# Patient Record
Sex: Female | Born: 1965 | Race: White | Hispanic: No | Smoking: Former smoker
Health system: Southern US, Community
[De-identification: ages and names within clinical notes are randomized; demographics above are authoritative.]

## PROBLEM LIST (undated history)

## (undated) DIAGNOSIS — R197 Diarrhea, unspecified: Secondary | ICD-10-CM

## (undated) DIAGNOSIS — M542 Cervicalgia: Secondary | ICD-10-CM

## (undated) DIAGNOSIS — G43909 Migraine, unspecified, not intractable, without status migrainosus: Secondary | ICD-10-CM

## (undated) HISTORY — DX: Diarrhea, unspecified: R19.7

## (undated) HISTORY — DX: Cervicalgia: M54.2

---

## 1995-06-02 HISTORY — PX: TUBAL LIGATION: SHX77

## 2010-07-04 DIAGNOSIS — R079 Chest pain, unspecified: Secondary | ICD-10-CM

## 2013-06-30 ENCOUNTER — Emergency Department (HOSPITAL_COMMUNITY)
Admission: EM | Admit: 2013-06-30 | Discharge: 2013-07-01 | Disposition: A | Discharge status: Home / Self Care | Payer: 59 | Attending: Emergency Medicine | Admitting: Emergency Medicine

## 2013-06-30 ENCOUNTER — Emergency Department (HOSPITAL_COMMUNITY): Payer: 59

## 2013-06-30 ENCOUNTER — Encounter (HOSPITAL_COMMUNITY): Payer: Self-pay | Admitting: Emergency Medicine

## 2013-06-30 DIAGNOSIS — Z3202 Encounter for pregnancy test, result negative: Secondary | ICD-10-CM | POA: Insufficient documentation

## 2013-06-30 DIAGNOSIS — R42 Dizziness and giddiness: Secondary | ICD-10-CM | POA: Insufficient documentation

## 2013-06-30 DIAGNOSIS — F17200 Nicotine dependence, unspecified, uncomplicated: Secondary | ICD-10-CM | POA: Insufficient documentation

## 2013-06-30 DIAGNOSIS — R109 Unspecified abdominal pain: Secondary | ICD-10-CM

## 2013-06-30 DIAGNOSIS — F411 Generalized anxiety disorder: Secondary | ICD-10-CM | POA: Insufficient documentation

## 2013-06-30 DIAGNOSIS — R197 Diarrhea, unspecified: Secondary | ICD-10-CM | POA: Insufficient documentation

## 2013-06-30 DIAGNOSIS — R198 Other specified symptoms and signs involving the digestive system and abdomen: Secondary | ICD-10-CM | POA: Insufficient documentation

## 2013-06-30 DIAGNOSIS — K921 Melena: Secondary | ICD-10-CM | POA: Insufficient documentation

## 2013-06-30 DIAGNOSIS — G43909 Migraine, unspecified, not intractable, without status migrainosus: Secondary | ICD-10-CM | POA: Insufficient documentation

## 2013-06-30 DIAGNOSIS — K5900 Constipation, unspecified: Secondary | ICD-10-CM | POA: Insufficient documentation

## 2013-06-30 DIAGNOSIS — R110 Nausea: Secondary | ICD-10-CM | POA: Insufficient documentation

## 2013-06-30 HISTORY — DX: Migraine, unspecified, not intractable, without status migrainosus: G43.909

## 2013-06-30 LAB — CBC WITH DIFFERENTIAL/PLATELET
BLASTS: 0 %
Band Neutrophils: 0 % (ref 0–10)
Basophils Absolute: 0 10*3/uL (ref 0.0–0.1)
Basophils Relative: 0 % (ref 0–1)
Eosinophils Absolute: 0 10*3/uL (ref 0.0–0.7)
Eosinophils Relative: 0 % (ref 0–5)
HCT: 39.8 % (ref 36.0–46.0)
Hemoglobin: 13.9 g/dL (ref 12.0–15.0)
Lymphocytes Relative: 7 % — ABNORMAL LOW (ref 12–46)
Lymphs Abs: 1.8 10*3/uL (ref 0.7–4.0)
MCH: 30.7 pg (ref 26.0–34.0)
MCHC: 34.9 g/dL (ref 30.0–36.0)
MCV: 87.9 fL (ref 78.0–100.0)
METAMYELOCYTES PCT: 0 %
MONOS PCT: 6 % (ref 3–12)
MYELOCYTES: 0 %
Monocytes Absolute: 1.6 10*3/uL — ABNORMAL HIGH (ref 0.1–1.0)
Neutro Abs: 22.8 10*3/uL — ABNORMAL HIGH (ref 1.7–7.7)
Neutrophils Relative %: 87 % — ABNORMAL HIGH (ref 43–77)
PLATELETS: 285 10*3/uL (ref 150–400)
Promyelocytes Absolute: 0 %
RBC: 4.53 MIL/uL (ref 3.87–5.11)
RDW: 12.4 % (ref 11.5–15.5)
WBC: 26.2 10*3/uL — AB (ref 4.0–10.5)
nRBC: 0 /100 WBC

## 2013-06-30 LAB — URINE MICROSCOPIC-ADD ON

## 2013-06-30 LAB — BASIC METABOLIC PANEL
BUN: 16 mg/dL (ref 6–23)
CHLORIDE: 100 meq/L (ref 96–112)
CO2: 25 mEq/L (ref 19–32)
Calcium: 9.5 mg/dL (ref 8.4–10.5)
Creatinine, Ser: 0.61 mg/dL (ref 0.50–1.10)
GFR calc Af Amer: >90 mL/min (ref >90)
GFR calc non Af Amer: >90 mL/min (ref >90)
GLUCOSE: 113 mg/dL — AB (ref 70–99)
POTASSIUM: 4.3 meq/L (ref 3.7–5.3)
Sodium: 139 mEq/L (ref 137–147)

## 2013-06-30 LAB — URINALYSIS, ROUTINE W REFLEX MICROSCOPIC
BILIRUBIN URINE: NEGATIVE
Glucose, UA: NEGATIVE mg/dL
Ketones, ur: NEGATIVE mg/dL
Leukocytes, UA: NEGATIVE
Nitrite: NEGATIVE
PH: 5.5 (ref 5.0–8.0)
Protein, ur: NEGATIVE mg/dL
Urobilinogen, UA: 0.2 mg/dL (ref 0.0–1.0)

## 2013-06-30 MED ORDER — IOHEXOL 300 MG/ML  SOLN
100.0000 mL | Freq: Once | INTRAMUSCULAR | Status: AC | PRN
  Administered 2013-06-30: 100 mL via INTRAVENOUS

## 2013-06-30 MED ORDER — IOHEXOL 300 MG/ML  SOLN
50.0000 mL | Freq: Once | INTRAMUSCULAR | Status: AC | PRN
  Administered 2013-06-30: 50 mL via ORAL

## 2013-06-30 MED ORDER — ONDANSETRON HCL 4 MG/2ML IJ SOLN
4.0000 mg | Freq: Once | INTRAMUSCULAR | Status: AC
Start: 2013-06-30 — End: 2013-06-30
  Administered 2013-06-30: 4 mg via INTRAVENOUS
  Filled 2013-06-30: qty 2

## 2013-06-30 MED ORDER — SODIUM CHLORIDE 0.9 % IV SOLN
Freq: Once | INTRAVENOUS | Status: AC
  Administered 2013-06-30: 999 mL via INTRAVENOUS

## 2013-06-30 NOTE — ED Notes (Signed)
PT C/O NEAR SYNCOPE BEFORE HAVING A BOWEL MOVEMENT AROUND 6:30PM. PT HAD ANOTHER BM TONIGHT AROUND 7:15 AND FELT LIKE SHE WAS GOING TO FAINT. PT C/O NUMBNESS IN HER FINGERTIPS AND HAD HEART BURN EARLIER TODAY AROUND 6:15.

## 2013-06-30 NOTE — ED Notes (Signed)
Patient ambulated to bathroom with no assistance or difficulty. Gait steady.  

## 2013-06-30 NOTE — ED Provider Notes (Signed)
CSN: [id number]     Arrival date & time 06/30/13  2113 History   First MD Initiated Contact with Patient 06/30/13 2127     Chief Complaint  Patient presents with  . Near Syncope   (Consider location/radiation/quality/duration/timing/severity/associated sxs/prior Treatment) Patient is a 48 y.o. female presenting with near-syncope. The history is provided by the patient.  Near Syncope This is a new problem. The current episode started today. The problem occurs intermittently. The problem has been resolved. Associated symptoms include abdominal pain, a change in bowel habit and nausea. Pertinent negatives include no chest pain, chills, congestion, coughing, diaphoresis, fever, headaches, neck pain, numbness, rash, urinary symptoms, vomiting or weakness. Exacerbated by: defecation. She has tried nothing for the symptoms. The treatment provided no relief.    Patient reports an assumed hx of IBS and reports intermittent episodes of constipation and diarrhea.  Tonight, she states that she had three episodes of "runny stools" with cramping abdominal pain during defecation. She reports the pain was so intense that she felt like she was going to faint.  She also states that she noticed small amt of blood and mucus in her stools and bright red blood on the tissue after wiping.  She came to the ED tonight because of the blood and mucus in her stools and the near syncope which she states is different than her previous episodes.  She denies fever, vomiting, chest pain or dyspnea.     Past Medical History  Diagnosis Date  . Migraine    History reviewed. No pertinent past surgical history. History reviewed. No pertinent family history. History  Substance Use Topics  . Smoking status: Current Every Day Smoker  . Smokeless tobacco: Not on file  . Alcohol Use: No   OB History   Grav Para Term Preterm Abortions TAB SAB Ect Mult Living                 Review of Systems  Constitutional: Negative for  fever, chills, diaphoresis and appetite change.  HENT: Negative for congestion.   Respiratory: Negative for cough, chest tightness and shortness of breath.   Cardiovascular: Positive for near-syncope. Negative for chest pain.  Gastrointestinal: Positive for nausea, abdominal pain, diarrhea, constipation, blood in stool and change in bowel habit. Negative for vomiting, abdominal distention and rectal pain.  Genitourinary: Negative for dysuria, flank pain, decreased urine volume, vaginal bleeding, difficulty urinating and vaginal pain.  Musculoskeletal: Negative for back pain and neck pain.  Skin: Negative for color change and rash.  Neurological: Positive for light-headedness. Negative for dizziness, syncope, weakness, numbness and headaches.       Near syncope  Hematological: Negative for adenopathy.  All other systems reviewed and are negative.    Allergies  Review of patient's allergies indicates no known allergies.  Home Medications   Current Outpatient Rx  Name  Route  Sig  Dispense  Refill  . cyclobenzaprine (FLEXERIL) 10 MG tablet   Oral   Take 10 mg by mouth 3 (three) times daily as needed for muscle spasms.          BP 103/65  Pulse 97  Temp(Src) 98.4 F (36.9 C) (Oral)  Resp 20  Ht 5\' 1"  (1.549 m)  Wt 135 lb (61.236 kg)  BMI 25.52 kg/m2  SpO2 98%   Physical Exam  Nursing note and vitals reviewed. Constitutional: She is oriented to person, place, and time. She appears well-developed and well-nourished.  Anxious appearing  HENT:  Head: Normocephalic and atraumatic.  Eyes: Conjunctivae and EOM are normal. Pupils are equal, round, and reactive to light.  Neck: Normal range of motion. Neck supple.  Cardiovascular: Normal rate, regular rhythm, normal heart sounds and intact distal pulses.   No murmur heard. Pulmonary/Chest: Effort normal and breath sounds normal. No respiratory distress. She exhibits no tenderness.  Abdominal: Soft. Bowel sounds are normal. She  exhibits no distension and no mass. There is no tenderness. There is no rebound and no guarding.  Musculoskeletal: Normal range of motion.  Neurological: She is alert and oriented to person, place, and time. She exhibits normal muscle tone. Coordination normal.  Skin: Skin is warm and dry. No rash noted.    ED Course  Procedures (including critical care time) Labs Review Labs Reviewed  CBC WITH DIFFERENTIAL - Abnormal; Notable for the following:    WBC 26.2 (*)    Neutrophils Relative % 87 (*)    Lymphocytes Relative 7 (*)    Neutro Abs 22.8 (*)    Monocytes Absolute 1.6 (*)    All other components within normal limits  BASIC METABOLIC PANEL - Abnormal; Notable for the following:    Glucose, Bld 113 (*)    All other components within normal limits  URINALYSIS, ROUTINE W REFLEX MICROSCOPIC - Abnormal; Notable for the following:    Color, Urine AMBER (*)    Specific Gravity, Urine >1.030 (*)    Hgb urine dipstick LARGE (*)    All other components within normal limits  URINE MICROSCOPIC-ADD ON - Abnormal; Notable for the following:    Casts HYALINE CASTS (*)    All other components within normal limits   Imaging Review Ct Abdomen Pelvis W Contrast  07/01/2013   CLINICAL DATA:  Elevated white blood cell count. Constipation however syncope  EXAM: CT ABDOMEN AND PELVIS WITH CONTRAST  TECHNIQUE: Multidetector CT imaging of the abdomen and pelvis was performed using the standard protocol following bolus administration of intravenous contrast.  CONTRAST:  50mL OMNIPAQUE IOHEXOL 300 MG/ML SOLN, OMNIPAQUE IOHEXOL 300 MG/ML SOLN  COMPARISON:  None.  FINDINGS: View lung bases are clear.  No pericardial fluid.  No focal hepatic lesion. The gallbladder, pancreas, spleen, adrenal glands, and kidneys are normal.  Stomach, small bowel, appendix, cecum normal. There is normal volume of stool throughout the colon. No evidence of colonic inflammation. The rectum and sigmoid colon are normal.   Abdominal aorta is normal caliber. No retroperitoneal periportal lymphadenopathy.  No free fluid the pelvis. No distal ureteral stones or bladder stones. IUD within expected location within the uterus. No pelvic lymphadenopathy. No aggressive osseous lesion.  IMPRESSION: 1. No acute findings in the abdomen pelvis. 2. Normal appendix. 3. Normal volume stool within the colon.   Electronically Signed   By: Genevive Bi M.D.   On: 07/01/2013 00:25    EKG Interpretation   None       Informed by patient's RN that urine pregnancy is negative, result did not cross over into EPIC  MDM     Patient is anxious appearing and slightly tachycardic, but non-toxic appearing.  She denies chest pain, dyspnea, and abdominal pain.  Reports hx of IBS, but not diagnosed by a physician.  Will order labs, check orthostatics and give IVF's.    11:16 PM Patient is resting comfortably, receiving IVF's.  Reviewed labs, significant leukocytosis without other significant findings, will order CT abdomen/pelvis.    CT results negative for acute process.  Pt is well appearing, VSS.  abd is soft, NT  w/o peritoneal signs.  I have discussed labs with patient and EDP.  Pt is feeling better, no orthostatic hypotension, or dizziness and appears stable for discharge.  She agrees to f/u with her PMD on Monday to have CBC rechecked.  She also agrees to return here if her symptoms return.   Angeles Paolucci L. Trisha Mangleriplett, PA-C 07/02/13 1359

## 2013-07-01 NOTE — Discharge Instructions (Signed)
Abdominal Pain, Adult  Many things can cause belly (abdominal) pain. Most times, the belly pain is not dangerous. Many cases of belly pain can be watched and treated at home.  HOME CARE   · Do not take medicines that help you go poop (laxatives) unless told to by your doctor.  · Only take medicine as told by your doctor.  · Eat or drink as told by your doctor. Your doctor will tell you if you should be on a special diet.  GET HELP IF:  · You do not know what is causing your belly pain.  · You have belly pain while you are sick to your stomach (nauseous) or have runny poop (diarrhea).  · You have pain while you pee or poop.  · Your belly pain wakes you up at night.  · You have belly pain that gets worse or better when you eat.  · You have belly pain that gets worse when you eat fatty foods.  GET HELP RIGHT AWAY IF:   · The pain does not go away within 2 hours.  · You have a fever.  · You keep throwing up (vomiting).  · The pain changes and is only in the right or left part of the belly.  · You have bloody or tarry looking poop.  MAKE SURE YOU:   · Understand these instructions.  · Will watch your condition.  · Will get help right away if you are not doing well or get worse.  Document Released: 11/04/2007 Document Revised: 03/08/2013 Document Reviewed: 01/25/2013  Exit[employer]® Patient Information ©2014 [employer].

## 2013-07-03 LAB — POCT PREGNANCY, URINE: PREG TEST UR: NEGATIVE

## 2013-07-03 NOTE — ED Provider Notes (Signed)
Medical screening examination/treatment/procedure(s) were performed by non-physician practitioner and as supervising physician I was immediately available for consultation/collaboration.  EKG Interpretation   None         Tyeasha Ebbs L Stepan Verrette, MD 07/03/13 1614 

## 2014-10-14 IMAGING — CT CT ABD-PELV W/ CM
2 of 4 series · 16 of 46 positions shown, 18 images · IV contrast (Omnipaque 300)
Comparison: None.

CLINICAL DATA: Elevated white blood cell count. Constipation
however syncope

EXAM:
CT ABDOMEN AND PELVIS WITH CONTRAST
TECHNIQUE: Multidetector CT imaging of the abdomen and pelvis was performed
using the standard protocol following bolus administration of
intravenous contrast.
CONTRAST:  50mL OMNIPAQUE IOHEXOL 300 MG/ML SOLN, 100mL OMNIPAQUE
IOHEXOL 300 MG/ML SOLN

[Series 2: abd_pel_with 5.0 b40f · axial · 0.68mm/px · z∈[-414,-28]mm · 13 of 87 slices shown, 15 images]
[im 5/87  soft-tissue]
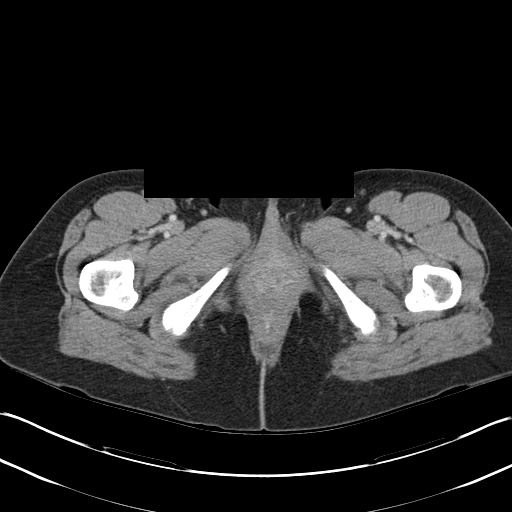
[im 5/87  bone]
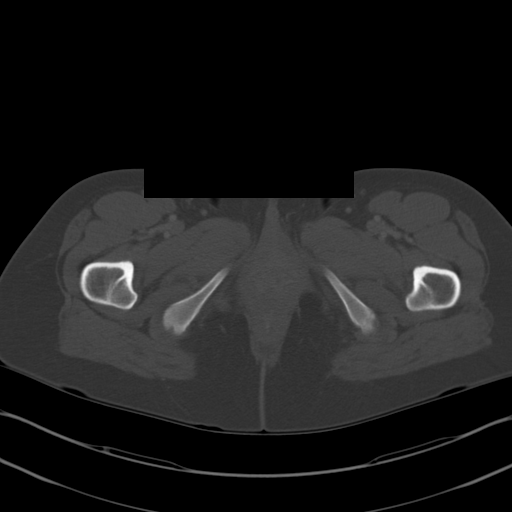
[im 13/87  soft-tissue]
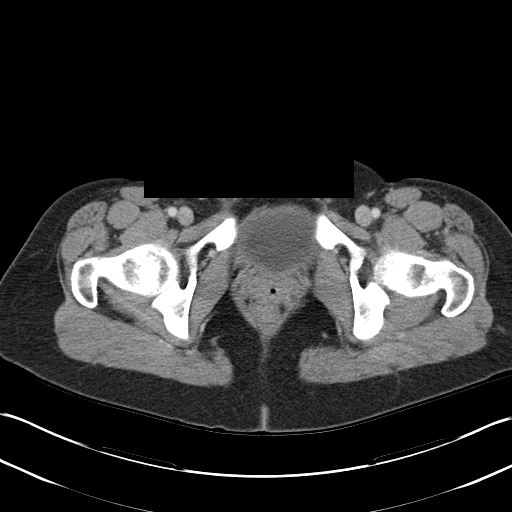
[im 18/87  soft-tissue]
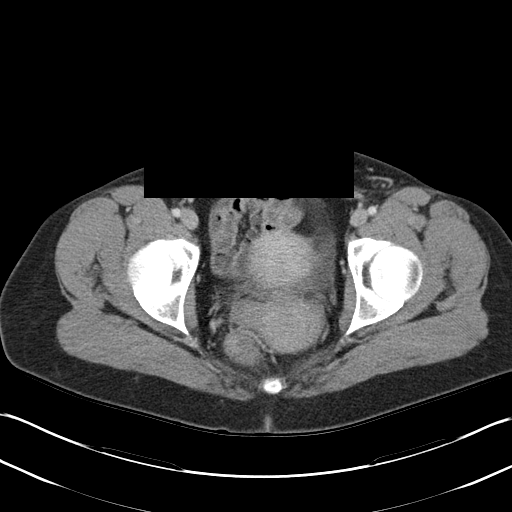
[im 26/87  soft-tissue]
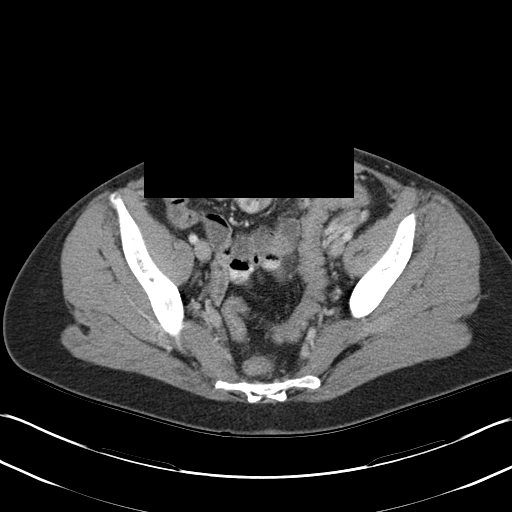
[im 31/87  soft-tissue]
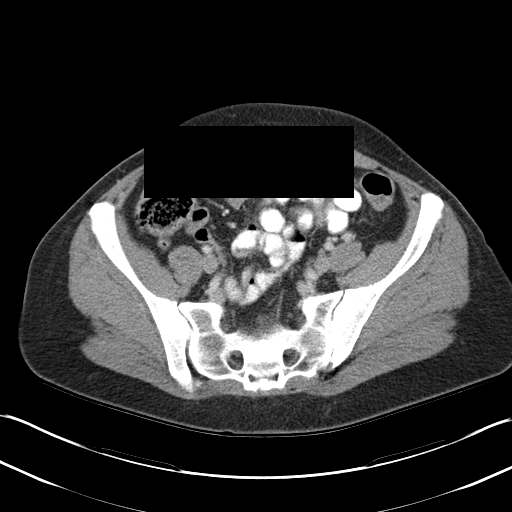
[im 39/87  soft-tissue]
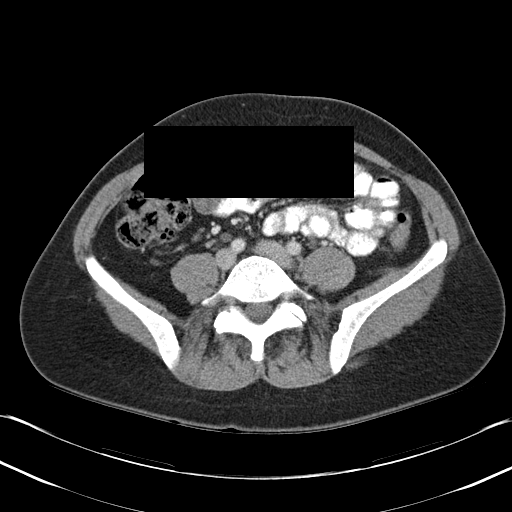
[im 44/87  soft-tissue]
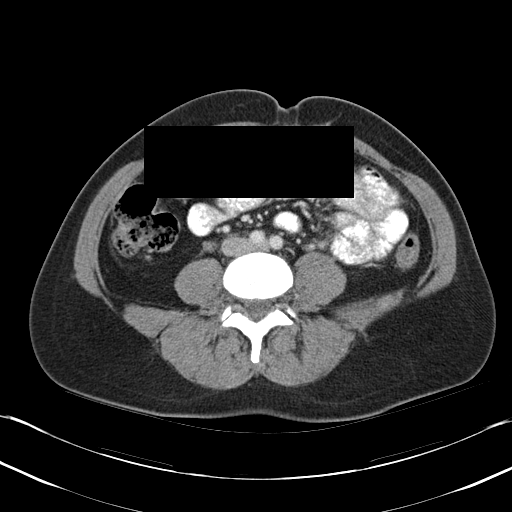
[im 48/87  soft-tissue]
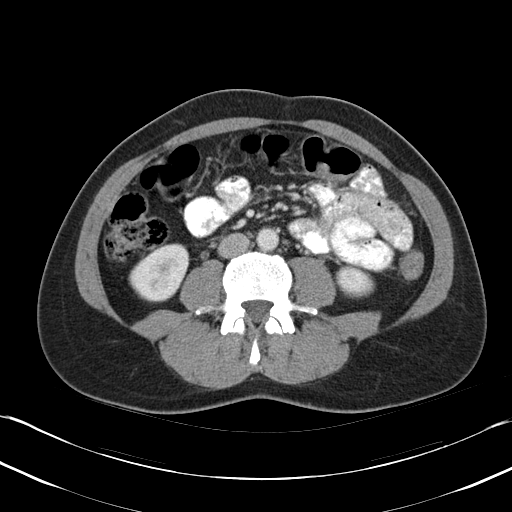
[im 56/87  soft-tissue]
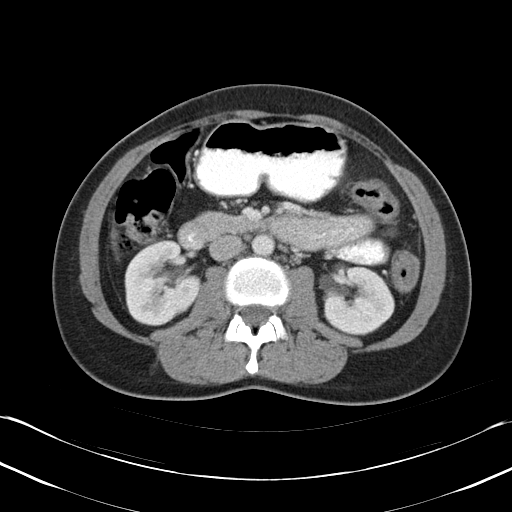
[im 56/87  bone]
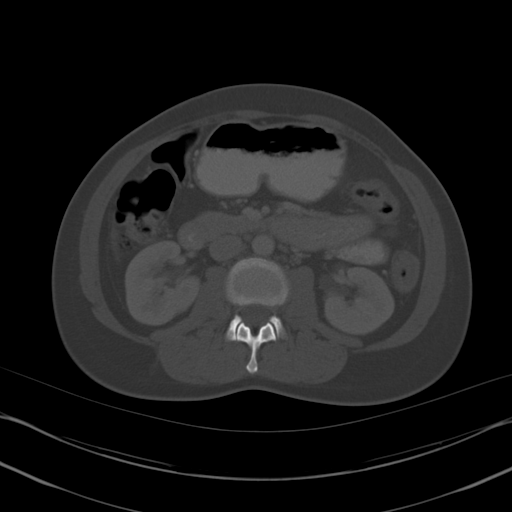
[im 61/87  soft-tissue]
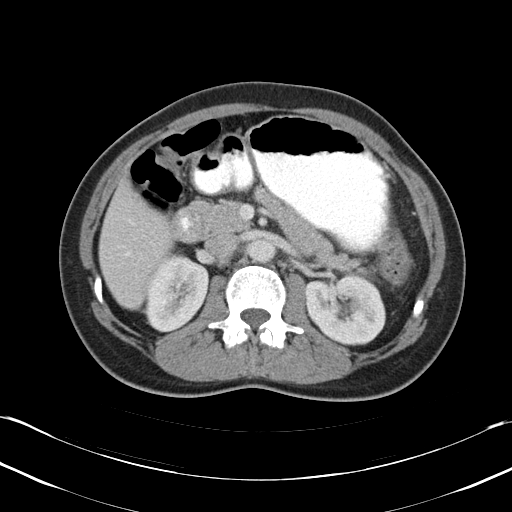
[im 69/87  soft-tissue]
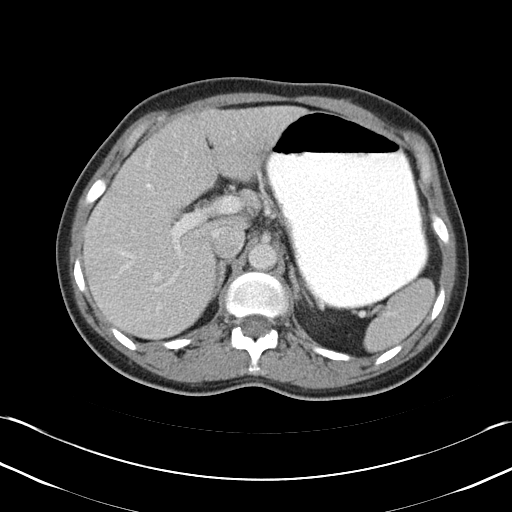
[im 74/87  soft-tissue]
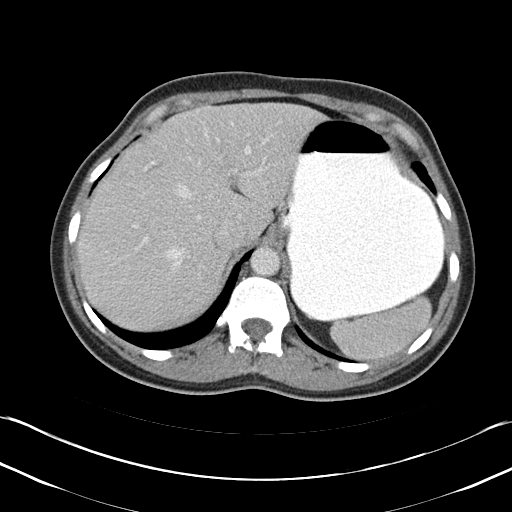
[im 82/87  soft-tissue]
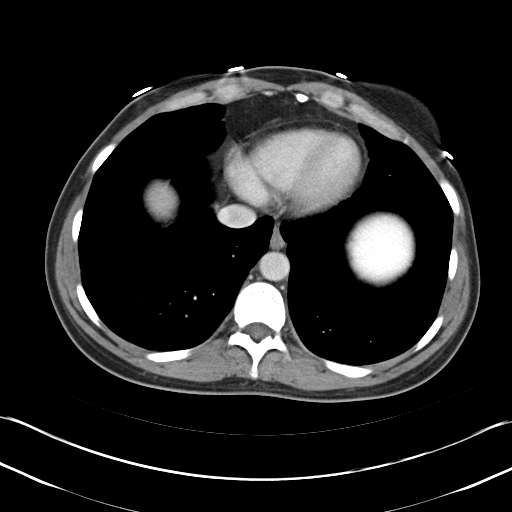

[Series 4: abd_pel_with 3.0 spo cor · coronal · 0.71mm/px · 3 of 75 slices shown]
[im 25/75  soft-tissue]
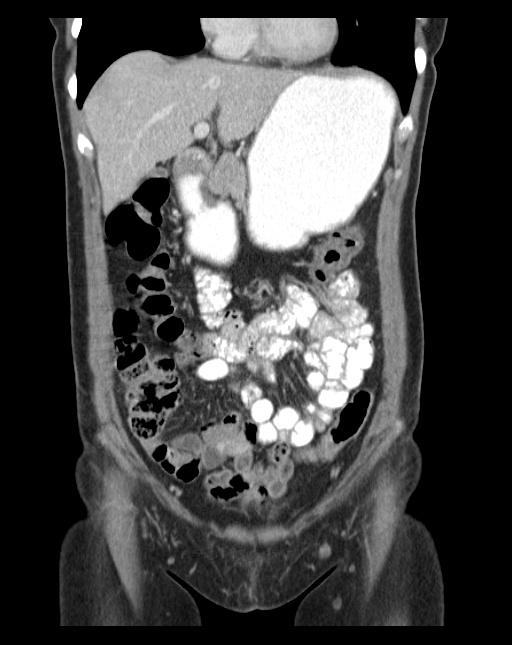
[im 33/75  soft-tissue]
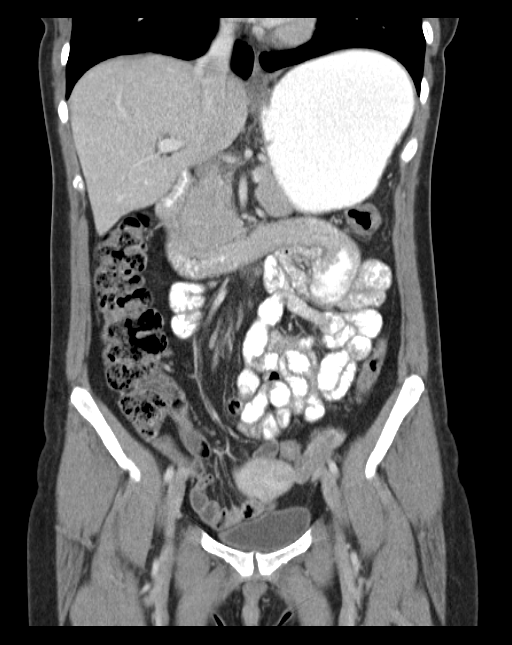
[im 42/75  soft-tissue]
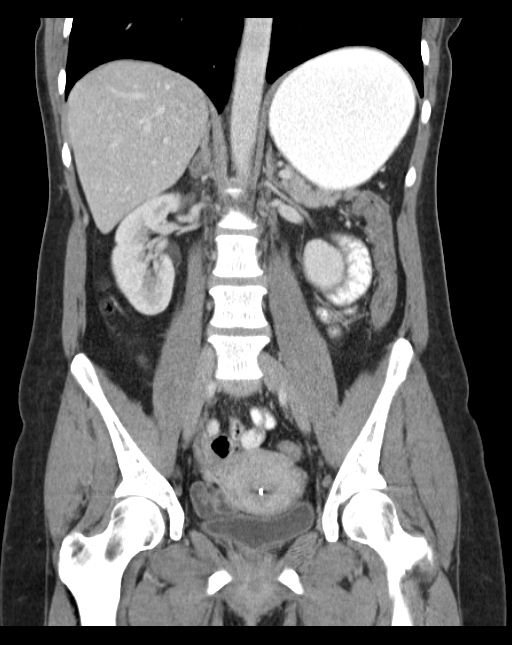

[16 of 46 positions shown; findings below may reference images not displayed]

FINDINGS: View lung bases are clear.  No pericardial fluid.

No focal hepatic lesion. The gallbladder, pancreas, spleen, adrenal
glands, and kidneys are normal.

Stomach, small bowel, appendix, cecum normal. There is normal volume
of stool throughout the colon. No evidence of colonic inflammation.
The rectum and sigmoid colon are normal.

Abdominal aorta is normal caliber. No retroperitoneal periportal
lymphadenopathy.

No free fluid the pelvis. No distal ureteral stones or bladder
stones. IUD within expected location within the uterus. No pelvic
lymphadenopathy. No aggressive osseous lesion.
IMPRESSION: 1. No acute findings in the abdomen pelvis.
2. Normal appendix.
3. Normal volume stool within the colon.

## 2016-01-23 ENCOUNTER — Ambulatory Visit
Admission: RE | Admit: 2016-01-23 | Discharge: 2016-01-23 | Disposition: A | Discharge status: Home / Self Care | Payer: No Typology Code available for payment source | Source: Ambulatory Visit | Attending: Physical Medicine and Rehabilitation | Admitting: Physical Medicine and Rehabilitation

## 2016-01-23 ENCOUNTER — Other Ambulatory Visit: Payer: Self-pay | Admitting: Physical Medicine and Rehabilitation

## 2016-01-23 DIAGNOSIS — Z0000 Encounter for general adult medical examination without abnormal findings: Secondary | ICD-10-CM

## 2016-02-24 ENCOUNTER — Telehealth (INDEPENDENT_AMBULATORY_CARE_PROVIDER_SITE_OTHER): Payer: Self-pay | Admitting: Internal Medicine

## 2016-02-24 ENCOUNTER — Encounter (INDEPENDENT_AMBULATORY_CARE_PROVIDER_SITE_OTHER): Payer: Self-pay | Admitting: Internal Medicine

## 2016-02-24 NOTE — Telephone Encounter (Signed)
error 

## 2016-02-25 ENCOUNTER — Encounter (INDEPENDENT_AMBULATORY_CARE_PROVIDER_SITE_OTHER): Payer: Self-pay | Admitting: Internal Medicine

## 2016-02-25 ENCOUNTER — Ambulatory Visit (INDEPENDENT_AMBULATORY_CARE_PROVIDER_SITE_OTHER): Payer: 59 | Admitting: Internal Medicine

## 2016-02-25 VITALS — BP 100/70 | HR 77 | Temp 98.2°F | Resp 18 | Ht 63.0 in | Wt 132.9 lb

## 2016-02-25 DIAGNOSIS — K5900 Constipation, unspecified: Secondary | ICD-10-CM | POA: Diagnosis not present

## 2016-02-25 DIAGNOSIS — K559 Vascular disorder of intestine, unspecified: Secondary | ICD-10-CM

## 2016-02-25 DIAGNOSIS — K5909 Other constipation: Secondary | ICD-10-CM

## 2016-02-25 MED ORDER — POLYETHYLENE GLYCOL 3350 17 GM/SCOOP PO POWD: 8.5000 g | Freq: Every day | ORAL | 5 refills | Status: DC

## 2016-02-25 MED ORDER — PSYLLIUM 28 % PO PACK: 1.0000 | PACK | Freq: Every day | ORAL | Status: AC

## 2016-02-25 NOTE — Patient Instructions (Addendum)
High fiber diet. Polyethylene glycol daily or every other day can titrate dose. Metamucil or equivalent 3-4 g a half a pack daily. Colonoscopy to be scheduled. Stool diary until the time of colonoscopy.

## 2016-02-25 NOTE — Progress Notes (Signed)
Presenting complaint;  Recent hospitalization for bloody diarrhea/colitis.  History of present illness:  Marie Campbell is 50 year old Caucasian female who is referred through courtesy of Dr. Doyne Keel and Ms. Roma Kayser PAC. Patient states she has had lifelong problems with constipation and she generally has been able to control this problem by watching her diet. Her baseline is usually 3 bowel movements per week. She states over the years she has had at least 20 syncopal episodes which occur during defecation. She is generally able to tell and would ask for help or lie flat and feel better after a while. In the evening of 02/14/2016 she experience severe abdominal cramping associated with diaphoreses and she passed out. He noted urgency and she passed large amount of blood per rectum. During the night she had another 3 bloody bowel movements. She went to emergency room at [facility] on 02/15/2016 and noted to have leukocytosis and CT revealed left-sided colitis. Patient was empirically treated with antibiotics. She felt better her leukocytosis resolved and she was discharged on 02/17/2016. Dr. Doyne Keel suggested GI evaluation. Patient recalls that she had taken 6 doses of ibuprofen day before and the day she had her symptoms(600 mg each time). Earlier that day she had steak. Her husband told her that she had food poisoning because state was not cooked. Patient was discharged on Augmentin but she developed chest tightness swelling around eyes and tongue and therefore this medication was discontinued. She states she's had 3 loose bowel movements since discharge but this morning she had a formed stool. He denies abdominal pain. She has good appetite. She has lost 3 pounds with this illness.   Current Medications: Outpatient Encounter Prescriptions as of 02/25/2016  Medication Sig  . cyclobenzaprine (FLEXERIL) 10 MG tablet Take 10 mg by mouth 3 (three) times daily as needed for muscle spasms.   No  facility-administered encounter medications on file as of 02/25/2016.    Past medical history:  History of migraine. It started when she was in her 78s. She had no problems for several years and now having recurrent migraine. Neck pain secondary to bulging days. She is scheduled to see a neurosurgeon. She had C-section in 4 93 and 97. She had bilateral tubal ligation with last C-section.  Allergies: Allergies  Allergen Reactions  . Augmentin [Amoxicillin-Pot Clavulanate] Swelling    Chest also became tight.        She is felt to be allergic to clavulanate. She has taken amoxicillin in the past without any problems.  Family history:  Mother died at age 102 due to complications of hemochromatosis(HCC). Father is 79 years old and in good health. She has 3 sisters and one brother in good health.  Social history:  She is married and has 3 healthy children. She has sons ages 56 and 66 and daughter age 40. He works as a Pensions consultant at [employer]. She does not drink alcohol. She quit cigarette smoking 1 year ago after having smoked off and on for 10 years no more than half a pack per day.   Social history:   Objective: Blood pressure 100/70, pulse 77, temperature 98.2 F (36.8 C), temperature source Oral, resp. rate 18, height 5\' 3"  (1.6 m), weight 132 lb 14.4 oz (60.3 kg). Patient is alert and in no acute distress. Conjunctiva is pink. Sclera is nonicteric Oropharyngeal mucosa is normal. No neck masses or thyromegaly noted. Cardiac exam with regular rhythm normal S1 and S2. No murmur or gallop noted. Lungs are clear to auscultation.  Abdomen is symmetrical. Bowel sounds are normal. On palpation abdomen is soft and nontender without organomegaly or masses. Rectal examination deferred. No LE edema or clubbing noted.  Labs/studies Results:  Abdominopelvic CT with contrast on 02/15/2016 Reveals wall thickening to distal transverse colon descending and sigmoid colon  consistent with acute colitis. CT films reviewed via Epic  Lab data from 02/15/2016 WBC 17.9, H&H 13.8 and 39.9 and platelet count 315K BUN 11 creatinine 0.55 glucose 110 serum sodium 138 potassium 4.0 chloride 102 CO2 23.3 serum calcium 9.7 bilirubin 0.3 AP 58 AST 20.5 and ALT 28. Total protein 7.7 and albumin 4.6.  Lab data from 02/16/2016 W BC 11.1 H&H 11.5 and 33.4. Platelet count 275K.  Assessment:  #1.Recent illness appears to be secondary to ischemic colitis although extent of colonic involvement more extensive than expected. She has recovered rapidly. She will undergo diagnostic colonoscopy to rule out other diagnoses. #2. Chronic constipation. She would be better off taking medication on schedule rather than when necessary. #3. Syncopal episodes appear to be vasovagal occurring with defecation.  Recommendations:  High fiber diet. Metamucil or equivalent 3 g by mouth daily at bedtime. Polyethylene glycol 8.5 g by mouth daily at bedtime which he can titrate the dose. Patient advised to keep stool diary until the time of colonoscopy which will be scheduled within the next few weeks. Patient advised to keep ibuprofen use to minimum. Office visit in 6 months for follow-up of chronic constipation.

## 2016-02-26 ENCOUNTER — Encounter (INDEPENDENT_AMBULATORY_CARE_PROVIDER_SITE_OTHER): Payer: Self-pay | Admitting: *Deleted

## 2016-02-26 ENCOUNTER — Other Ambulatory Visit (INDEPENDENT_AMBULATORY_CARE_PROVIDER_SITE_OTHER): Payer: Self-pay | Admitting: *Deleted

## 2016-02-26 DIAGNOSIS — K529 Noninfective gastroenteritis and colitis, unspecified: Secondary | ICD-10-CM

## 2016-02-26 DIAGNOSIS — K5900 Constipation, unspecified: Secondary | ICD-10-CM | POA: Insufficient documentation

## 2016-02-26 DIAGNOSIS — K5909 Other constipation: Secondary | ICD-10-CM

## 2016-02-27 DIAGNOSIS — K644 Residual hemorrhoidal skin tags: Secondary | ICD-10-CM | POA: Diagnosis not present

## 2016-02-27 DIAGNOSIS — K6289 Other specified diseases of anus and rectum: Secondary | ICD-10-CM | POA: Diagnosis not present

## 2016-02-27 DIAGNOSIS — K529 Noninfective gastroenteritis and colitis, unspecified: Secondary | ICD-10-CM | POA: Diagnosis not present

## 2016-02-28 ENCOUNTER — Ambulatory Visit (HOSPITAL_COMMUNITY)
Admission: RE | Admit: 2016-02-28 | Discharge: 2016-02-28 | Disposition: A | Discharge status: Home / Self Care | Payer: 59 | Source: Ambulatory Visit | Attending: Internal Medicine | Admitting: Internal Medicine

## 2016-02-28 ENCOUNTER — Encounter (HOSPITAL_COMMUNITY)
Admission: RE | Disposition: A | Discharge status: Home / Self Care | Payer: Self-pay | Source: Ambulatory Visit | Attending: Internal Medicine

## 2016-02-28 ENCOUNTER — Encounter (HOSPITAL_COMMUNITY): Payer: Self-pay | Admitting: *Deleted

## 2016-02-28 DIAGNOSIS — Z09 Encounter for follow-up examination after completed treatment for conditions other than malignant neoplasm: Secondary | ICD-10-CM | POA: Insufficient documentation

## 2016-02-28 DIAGNOSIS — K6289 Other specified diseases of anus and rectum: Secondary | ICD-10-CM | POA: Insufficient documentation

## 2016-02-28 DIAGNOSIS — Z8719 Personal history of other diseases of the digestive system: Secondary | ICD-10-CM | POA: Diagnosis not present

## 2016-02-28 DIAGNOSIS — K529 Noninfective gastroenteritis and colitis, unspecified: Secondary | ICD-10-CM

## 2016-02-28 DIAGNOSIS — Z87891 Personal history of nicotine dependence: Secondary | ICD-10-CM | POA: Diagnosis not present

## 2016-02-28 DIAGNOSIS — K644 Residual hemorrhoidal skin tags: Secondary | ICD-10-CM | POA: Diagnosis not present

## 2016-02-28 DIAGNOSIS — K5909 Other constipation: Secondary | ICD-10-CM

## 2016-02-28 DIAGNOSIS — K5900 Constipation, unspecified: Secondary | ICD-10-CM | POA: Insufficient documentation

## 2016-02-28 HISTORY — PX: COLONOSCOPY: SHX5424

## 2016-02-28 SURGERY — COLONOSCOPY
Anesthesia: Moderate Sedation

## 2016-02-28 MED ORDER — STERILE WATER FOR IRRIGATION IR SOLN
Status: DC | PRN
  Administered 2016-02-28: 09:00:00

## 2016-02-28 MED ORDER — MEPERIDINE HCL 50 MG/ML IJ SOLN
INTRAMUSCULAR | Status: AC
  Filled 2016-02-28: qty 1

## 2016-02-28 MED ORDER — MEPERIDINE HCL 50 MG/ML IJ SOLN
INTRAMUSCULAR | Status: DC | PRN
  Administered 2016-02-28 (×2): 25 mg via INTRAVENOUS

## 2016-02-28 MED ORDER — MIDAZOLAM HCL 5 MG/5ML IJ SOLN
INTRAMUSCULAR | Status: DC | PRN
  Administered 2016-02-28: 2 mg via INTRAVENOUS
  Administered 2016-02-28: 1 mg via INTRAVENOUS
  Administered 2016-02-28: 2 mg via INTRAVENOUS
  Administered 2016-02-28: 1 mg via INTRAVENOUS

## 2016-02-28 MED ORDER — SODIUM CHLORIDE 0.9 % IV SOLN
INTRAVENOUS | Status: DC
  Administered 2016-02-28: 08:00:00 via INTRAVENOUS

## 2016-02-28 MED ORDER — MIDAZOLAM HCL 5 MG/5ML IJ SOLN
INTRAMUSCULAR | Status: AC
  Filled 2016-02-28: qty 10

## 2016-02-28 NOTE — Op Note (Signed)
[facility] Patient Name: Marie EdgeKathy Smejkal Procedure Date: 02/28/2016 8:49 AM MRN: [id number] Date of Birth: [birth year] Attending MD: Lionel DecemberNajeeb Rehman , MD CSN: [id number] Age: 50 Admit Type: Outpatient Procedure:                Colonoscopy Indications:              Follow-up of colitis Providers:                Lionel DecemberNajeeb Rehman, MD, Loma MessingLurae B. Patsy LagerAlbert RN, RN, Burke Keelsrisann                            Tilley, Technician Referring MD:             Roma KayserKatie Skillman, [facility] Medicines:                Meperidine 50 mg IV, Midazolam 6 mg IV Complications:            No immediate complications. Estimated Blood Loss:     Estimated blood loss: none. Procedure:                Pre-Anesthesia Assessment:                           - Prior to the procedure, a History and Physical                            was performed, and patient medications and                            allergies were reviewed. The patient's tolerance of                            previous anesthesia was also reviewed. The risks                            and benefits of the procedure and the sedation                            options and risks were discussed with the patient.                            All questions were answered, and informed consent                            was obtained. Prior Anticoagulants: The patient                            last took ibuprofen 14 days prior to the procedure.                            ASA Grade Assessment: I - A normal, healthy                            patient. After reviewing the risks and benefits,  the patient was deemed in satisfactory condition to                            undergo the procedure.                           After obtaining informed consent, the colonoscope                            was passed under direct vision. Throughout the                            procedure, the patient's blood pressure, pulse, and                            oxygen  saturations were monitored continuously. The                            EC-[id number] ([id number]) scope was introduced through                            the anus and advanced to the the terminal ileum.                            The colonoscopy was performed without difficulty.                            The patient tolerated the procedure well. The                            quality of the bowel preparation was good. The                            terminal ileum, ileocecal valve, appendiceal                            orifice, and rectum were photographed. Scope In: 9:16:06 AM Scope Out: 9:33:29 AM Scope Withdrawal Time: 0 hours 8 minutes 59 seconds  Total Procedure Duration: 0 hours 17 minutes 23 seconds  Findings:      The terminal ileum appeared normal.      The colon (entire examined portion) appeared normal.      External hemorrhoids were found during retroflexion. The hemorrhoids       were small.      Anal papilla(e) were hypertrophied. Impression:               - The examined portion of the ileum was normal.                           - The entire examined colon is normal.                           - External hemorrhoids.                           -  Anal papilla(e) were hypertrophied.                           - No specimens collected. Moderate Sedation:      Moderate (conscious) sedation was administered by the endoscopy nurse       and supervised by the endoscopist. The following parameters were       monitored: oxygen saturation, heart rate, blood pressure, CO2       capnography and response to care. Total physician intraservice time was       23 minutes. Recommendation:           - Patient has a contact number available for                            emergencies. The signs and symptoms of potential                            delayed complications were discussed with the                            patient. Return to normal activities tomorrow.                             Written discharge instructions were provided to the                            patient.                           - High fiber diet today.                           - Repeat colonoscopy in 10 years for screening                            purposes.                           - Continue present medications. Procedure Code(s):        --- Professional ---                           [phone], Colonoscopy, flexible; diagnostic, including                            collection of specimen(s) by brushing or washing,                            when performed (separate procedure)                           99152, Moderate sedation services provided by the                            same physician or other qualified health care  professional performing the diagnostic or                            therapeutic service that the sedation supports,                            requiring the presence of an independent trained                            observer to assist in the monitoring of the                            patient's level of consciousness and physiological                            status; initial 15 minutes of intraservice time,                            patient age 48 years or older                           [phone], Moderate sedation services; each additional                            15 minutes intraservice time Diagnosis Code(s):        --- Professional ---                           K64.4, Residual hemorrhoidal skin tags                           K62.89, Other specified diseases of anus and rectum                           K52.9, Noninfective gastroenteritis and colitis,                            unspecified CPT copyright 2016 American [employer] Association. All rights reserved. The codes documented in this report are preliminary and upon coder review may  be revised to meet current compliance requirements. Lionel December, MD Lionel December, MD 02/28/2016 9:43:04  AM This report has been signed electronically. Number of Addenda: 0

## 2016-02-28 NOTE — Discharge Instructions (Signed)
Anal Fissure, Adult An anal fissure is a small tear or crack in the skin around the anus. Bleeding from a fissure usually stops on its own within a few minutes. However, bleeding will often occur again with each bowel movement until the crack heals. CAUSES This condition may be caused by:  Passing large, hard stool (feces).  Frequent diarrhea.  Constipation.  Inflammatory bowel disease (Crohn disease or ulcerative colitis).  Infections.  Anal sex. SYMPTOMS Symptoms of this condition include:  Bleeding from the rectum.  Small amounts of blood seen on your stool, on toilet paper, or in the toilet after a bowel movement.  Painful bowel movements.  Itching or irritation around the anus. DIAGNOSIS A health care provider may diagnose this condition by closely examining the anal area. An anal fissure can usually be seen with careful inspection. In some cases, a rectal exam may be performed, or a short tube (anoscope) may be used to examine the anal canal. TREATMENT Treatment for this condition may include:  Taking steps to avoid constipation. This may include making changes to your diet, such as increasing your intake of fiber or fluid.  Taking fiber supplements. These supplements can soften your stool to help make bowel movements easier. Your health care provider may also prescribe a stool softener if your stool is often hard.  Taking sitz baths. This may help to heal the tear.  Using medicated creams or ointments. These may be prescribed to lessen discomfort. HOME CARE INSTRUCTIONS Eating and Drinking  Avoid foods that may be constipating, such as bananas and dairy products.  Drink enough fluid to keep your urine clear or pale yellow.  Maintain a diet that is high in fruits, whole grains, and vegetables. General Instructions  Keep the anal area as clean and dry as possible.  Take sitz baths as told by your health care provider. Do not use soap in the sitz  baths.  Take over-the-counter and prescription medicines only as told by your health care provider.  Use creams or ointments only as told by your health care provider.  Keep all follow-up visits as told by your health care provider. This is important. SEEK MEDICAL CARE IF:  You have more bleeding.  You have a fever.  You have diarrhea that is mixed with blood.  You continue to have pain.  Your problem is getting worse rather than better.   This information is not intended to replace advice given to you by your health care provider. Make sure you discuss any questions you have with your health care provider.   Document Released: 05/18/2005 Document Revised: 02/06/2015 Document Reviewed: 08/13/2014 Elsevier Interactive Patient Education 2016 [employer]   Hemorrhoids  Hemorrhoids are puffy (swollen) veins around the rectum or anus. Hemorrhoids can cause pain, itching, bleeding, or irritation. HOME CARE  Eat foods with fiber, such as whole grains, beans, nuts, fruits, and vegetables. Ask your doctor about taking products with added fiber in them (fibersupplements).  Drink enough fluid to keep your pee (urine) clear or pale yellow.  Exercise often.  Go to the bathroom when you have the urge to poop. Do not wait.  Avoid straining to poop (bowel movement).  Keep the butt area dry and clean. Use wet toilet paper or moist paper towels.  Medicated creams and medicine inserted into the anus (anal suppository) may be used or applied as told.  Only take medicine as told by your doctor.  Take a warm water bath (sitz bath) for  15-20 minutes to ease pain. Do this 3-4 times a day.  Place ice packs on the area if it is tender or puffy. Use the ice packs between the warm water baths.  Put ice in a plastic bag.  Place a towel between your skin and the bag.  Leave the ice on for 15-20 minutes, 03-04 times a day.  Do not use a donut-shaped pillow or sit on the toilet for a  long time. GET HELP RIGHT AWAY IF:   You have more pain that is not controlled by treatment or medicine.  You have bleeding that will not stop.  You have trouble or are unable to poop (bowel movement).  You have pain or puffiness outside the area of the hemorrhoids. MAKE SURE YOU:   Understand these instructions.  Will watch your condition.  Will get help right away if you are not doing well or get worse.   This information is not intended to replace advice given to you by your health care provider. Make sure you discuss any questions you have with your health care provider.   Document Released: 02/25/2008 Document Revised: 05/04/2012 Document Reviewed: 03/29/2012 Elsevier Interactive Patient Education 2016 [employer] Colonoscopy, Care After Refer to this sheet in the next few weeks. These instructions provide you with information on caring for yourself after your procedure. Your health care provider may also give you more specific instructions. Your treatment has been planned according to current medical practices, but problems sometimes occur. Call your health care provider if you have any problems or questions after your procedure. WHAT TO EXPECT AFTER THE PROCEDURE  After your procedure, it is typical to have the following:  A small amount of blood in your stool.  Moderate amounts of gas and mild abdominal cramping or bloating. HOME CARE INSTRUCTIONS  Do not drive, operate machinery, or sign important documents for 24 hours.  You may shower and resume your regular physical activities, but move at a slower pace for the first 24 hours.  Take frequent rest periods for the first 24 hours.  Walk around or put a warm pack on your abdomen to help reduce abdominal cramping and bloating.  Drink enough fluids to keep your urine clear or pale yellow.  You may resume your normal diet as instructed by your health care provider. Avoid heavy or fried foods that are hard to  digest.  Avoid drinking alcohol for 24 hours or as instructed by your health care provider.  Only take over-the-counter or prescription medicines as directed by your health care provider.  If a tissue sample (biopsy) was taken during your procedure:  Do not take aspirin or blood thinners for 7 days, or as instructed by your health care provider.  Do not drink alcohol for 7 days, or as instructed by your health care provider.  Eat soft foods for the first 24 hours. SEEK MEDICAL CARE IF: You have persistent spotting of blood in your stool 2-3 days after the procedure. SEEK IMMEDIATE MEDICAL CARE IF:  You have more than a small spotting of blood in your stool.  You pass large blood clots in your stool.  Your abdomen is swollen (distended).  You have nausea or vomiting.  You have a fever.  You have increasing abdominal pain that is not relieved with medicine.   This information is not intended to replace advice given to you by your health care provider. Make sure you discuss any questions you have with your health care provider.  Document Released: 12/31/2003 Document Revised: 03/08/2013 Document Reviewed: 01/23/2013 Elsevier Interactive Patient Education [employer] Resume usual medications and diet. Keep stool artery and call with progress report in 6-8 weeks or earlier if needed. Next screening exam in 10 years.

## 2016-02-28 NOTE — H&P (Signed)
Marie Campbell is an 50 y.o. female.   Chief Complaint: Patient is here for diagnostic colonoscopy. HPI: Patient is 50 year old Caucasian female who was briefly hospitalized at [facility] for bloody diarrhea 2 weeks ago and noted to have left-sided colitis. She was treated with Augmentin which was discontinued because of side effects. Her symptoms have resolved. She is undergoing diagnostic evaluation. Family history is negative for CRC.  Past Medical History:  Diagnosis Date  . Bloody diarrhea   . Migraine   . Neck pain    per patient 2 buldging disc in neck    Past Surgical History:  Procedure Laterality Date  . CESAREAN SECTION  [phone],1993,1997  . TUBAL LIGATION  1997    Family History  Problem Relation Age of Onset  . Diverticulitis Father   . Hypotension Father   . Hypertension Sister   . Inflammatory bowel disease Sister   . Healthy Brother   . Healthy Sister   . Hypertension Sister   . Healthy Daughter   . Constipation Daughter   . Healthy Son    Social History:  reports that she quit smoking about a year ago. Her smoking use included Cigarettes. She has never used smokeless tobacco. She reports that she does not drink alcohol or use drugs.  Allergies:  Allergies  Allergen Reactions  . Augmentin [Amoxicillin-Pot Clavulanate] Swelling    Chest also became tight.    Medications Prior to Admission  Medication Sig Dispense Refill  . polyethylene glycol powder (GLYCOLAX/MIRALAX) powder Take 8.5 g by mouth daily. 500 g 5  . cyclobenzaprine (FLEXERIL) 10 MG tablet Take 10 mg by mouth 3 (three) times daily as needed for muscle spasms.    Marland Kitchen. psyllium (METAMUCIL SMOOTH TEXTURE) 28 % packet Take 1 packet by mouth at bedtime.      No results found for this or any previous visit (from the past 48 hour(s)). No results found.  ROS  Blood pressure 104/65, pulse 81, temperature 97.8 F (36.6 C), temperature source Oral, resp. rate 16, height 5\' 3"  (1.6 m), weight 132 lb (59.9  kg), SpO2 99 %. Physical Exam  Constitutional: She appears well-developed and well-nourished.  HENT:  Mouth/Throat: Oropharynx is clear and moist.  Eyes: Conjunctivae are normal. No scleral icterus.  Neck: No thyromegaly present.  Cardiovascular: Normal rate, regular rhythm and normal heart sounds.   No murmur heard. Respiratory: Effort normal and breath sounds normal.  GI: Soft. She exhibits no distension and no mass.  Musculoskeletal: She exhibits no edema.  Lymphadenopathy:    She has no cervical adenopathy.  Neurological: She is alert.  Skin: Skin is warm and dry.     Assessment/Plan Recent bout colitis. Diagnostic colonoscopy.  Lionel DecemberNajeeb Sebastion Jun, MD 02/28/2016, 9:05 AM

## 2016-03-04 ENCOUNTER — Encounter (HOSPITAL_COMMUNITY): Payer: Self-pay | Admitting: Internal Medicine

## 2016-03-16 ENCOUNTER — Encounter (INDEPENDENT_AMBULATORY_CARE_PROVIDER_SITE_OTHER): Payer: Self-pay

## 2016-03-19 ENCOUNTER — Telehealth (INDEPENDENT_AMBULATORY_CARE_PROVIDER_SITE_OTHER): Payer: Self-pay | Admitting: Internal Medicine

## 2016-03-19 NOTE — Telephone Encounter (Signed)
Patient called, wants a script for Miralax because it is so expensive over the counter.  [phone]

## 2016-03-20 ENCOUNTER — Other Ambulatory Visit (INDEPENDENT_AMBULATORY_CARE_PROVIDER_SITE_OTHER): Payer: Self-pay | Admitting: *Deleted

## 2016-03-20 MED ORDER — POLYETHYLENE GLYCOL 3350 17 GM/SCOOP PO POWD: 8.5000 g | Freq: Every day | ORAL | 5 refills | Status: AC

## 2016-03-20 NOTE — Telephone Encounter (Signed)
Rx was sent to the patient's pharmacy. A message was left on her voicemail , [facility] [place]

## 2016-08-25 ENCOUNTER — Ambulatory Visit (INDEPENDENT_AMBULATORY_CARE_PROVIDER_SITE_OTHER): Payer: 59 | Admitting: Internal Medicine

## 2016-08-25 ENCOUNTER — Encounter (INDEPENDENT_AMBULATORY_CARE_PROVIDER_SITE_OTHER): Payer: Self-pay | Admitting: Internal Medicine

## 2017-05-08 IMAGING — CR DG CHEST 1V
1 series · 1 of 1 positions shown · non-contrast
Comparison: No recent prior.

CLINICAL DATA: Routine month exam.  History of smoking.

EXAM:
CHEST 1 VIEW

[w chest pa]
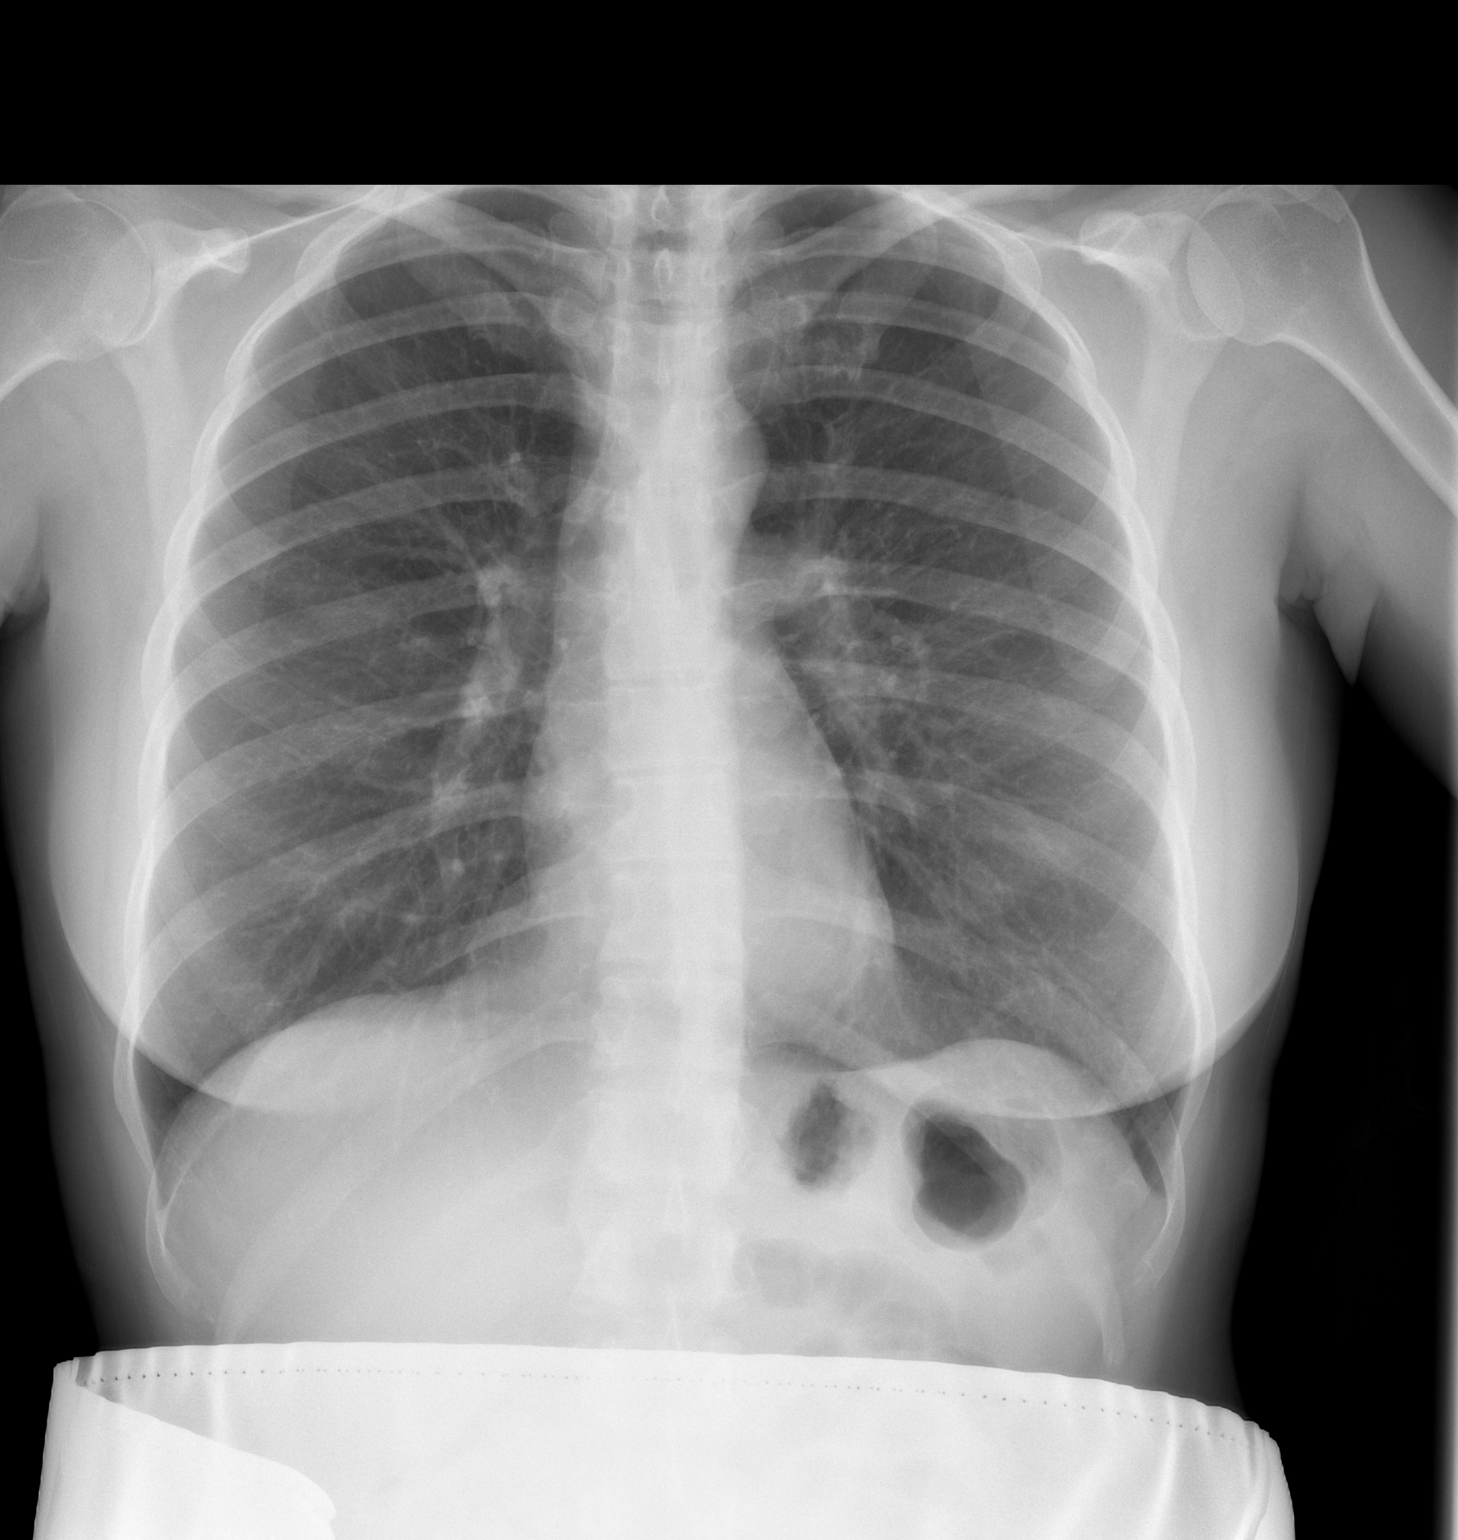

[1 of 1 positions shown; findings below may reference images not displayed]

FINDINGS: Mediastinum and hilar structures normal. Lungs are clear. Heart size
normal. No pleural effusion or pneumothorax. No acute bony
abnormality.
IMPRESSION: No acute cardiopulmonary disease.
